# Patient Record
Sex: Male | Born: 1980 | Race: Black or African American | Hispanic: No | Marital: Married | Smoking: Current every day smoker
Health system: Southern US, Community
[De-identification: ages and names within clinical notes are randomized; demographics above are authoritative.]

---

## 2005-08-11 ENCOUNTER — Emergency Department (HOSPITAL_COMMUNITY): Admission: EM | Admit: 2005-08-11 | Discharge: 2005-08-11 | Payer: Self-pay | Admitting: Emergency Medicine

## 2007-01-01 ENCOUNTER — Emergency Department (HOSPITAL_COMMUNITY): Admission: EM | Admit: 2007-01-01 | Discharge: 2007-01-01 | Payer: Self-pay | Admitting: Emergency Medicine

## 2008-07-30 ENCOUNTER — Emergency Department (HOSPITAL_COMMUNITY): Admission: EM | Admit: 2008-07-30 | Discharge: 2008-07-30 | Payer: Self-pay | Admitting: Emergency Medicine

## 2008-09-12 ENCOUNTER — Ambulatory Visit (HOSPITAL_BASED_OUTPATIENT_CLINIC_OR_DEPARTMENT_OTHER): Admission: RE | Admit: 2008-09-12 | Discharge: 2008-09-12 | Payer: Self-pay | Admitting: Orthopedic Surgery

## 2010-10-07 NOTE — Op Note (Signed)
Bobby Lucas, AIKMAN                ACCOUNT NO.:  [id number]   MEDICAL RECORD NO.:  [id number]          PATIENT TYPE:  AMB   LOCATION:  [facility]                          FACILITY:  [facility]   PHYSICIAN:  Harvie Junior, M.D.   DATE OF BIRTH:  December 10, 1980   DATE OF PROCEDURE:  09/12/2008  DATE OF DISCHARGE:                               OPERATIVE REPORT   PREOPERATIVE DIAGNOSES:  Anterior cruciate ligament tear with lateral  meniscal tear.   POSTOPERATIVE DIAGNOSES:  1. Anterior cruciate ligament tear.  2. Lateral meniscal tear.  3. Chondromalacia of the medial femoral condyle.  4. Multiple osteocartilaginous loose bodies.   PRINCIPAL PROCEDURE:  1. Anterior cruciate ligament reconstruction with central 1/3 patellar      tendon allograft.  2. Arthroscopic removal of multiple osteocartilaginous loose bodies.  3. Resection of bucket-handle lateral meniscal tear.  4. Chondroplasty of medial and lateral femoral compartments with      drilling of the lateral osteochondral defect.   SURGEON:  Harvie Junior, MD   ASSISTANT:  Marshia Ly, PA   ANESTHESIA:  General.   BRIEF HISTORY:  Bobby Lucas is a 30 year old male with a long history of  having had twisting injury to his left knee.  He suffered anterior  cruciate ligament tear.  He was ultimately evaluated in the office and  noted to be Lachman positive and pivot positive.  He had lateral joint  line pain.  MRI was obtained, showed anterior cruciate tear with lateral  bucket.  Discussion for surgery was undertaken and the patient was taken  to the operating room for anterior cruciate ligament reconstruction.  We  talked preoperatively about autograft versus allograft and he did want  to proceed with an allograft reconstruction, and this was chosen for him  and he was brought to the operating room.   PROCEDURE:  The patient was brought to the operating room.  After  adequate anesthesia was obtained with general anesthetic, the patient  was placed supine on the operating table.  The left leg was then prepped  and draped in usual sterile fashion.  Once this was completed, the leg  was examined under anesthesia and noted to be Lachman positive and pivot  positive.  At this point, attention was turned towards routine prep and  drape and a routine arthroscope examination revealed there was an  obvious bucket-handle lateral meniscal tear.  This was released in the  front and released in the back and the piece was removed in toto.  Once  this was completed, attention was turned towards the lateral femoral  condyle because of her multiple cartilaginous loose bodies in the knee.  These were removed with the grasper, and at this point, attention was  turned towards the lateral femoral condyle where the downward slide  became obvious where there was about a 1 x 2 cm defect in the lateral  femoral condyle.  This was grade 3 and grade 4 in nature.  This was  cleaned up and actually then appeared to be a fair amount of grade 4  change in lateral femoral  condyle.  A 0.62 K-wire was then opened at  this point and drilled a multiple times, entered the marrow cavity right  through the osteochondral defect.  Following this, attention was turned  towards the central portion of the knee where the ACL completely torn.  Attention was turned to the medial side where the medial meniscus was  normal and the medial femoral condyle had some grade 2 change, which was  debrided.  Attention was turned back to the lateral side where after the  lateral bucket was resected, a resection shaver was used to shake down  the remaining meniscal rim.  Once this was contoured down, attention was  turned to the notch.  The notchplasty was performed with the combination  of a suction shaver and a motorized burr.  Once the notchplasty was  performed, attention was turned towards the ACL reconstruction.   The tibial guide was then advanced through the medial portal  and put in  place and the cannula was then advanced against the skin.  A small  incision was made at this point and then the guidewire was then placed  through the cannula directly to bone targeting the area in the tibia.  Once this was completed, this was overreamed to a level of 10 and a rasp  was used on the back portion of this tunnel.  Once this was completed, a  femoral over-the-top guide was used and the 6-mm anterior to the back, a  Beath needle was advanced up to distal lateral femur.  Once this was  completed, a 9-mm reamer was used.  Initially, a foot print was made to  make sure there was a back wall, then following this, we went to 25 mm.  Once that was completed, a notch was placed on the femoral tunnel and  then the graft was advanced into place.  It seated very nicely.  We did  about 27-mm tunnel on the femoral side to match a 25-mm bone plug.  At  this point, a guidewire was placed anterior to this bone plug and the  screw was then advanced through the guidewire just anterior to the bone  plug, locking this end on the back wall 7 x 25 screw.  Attention was  then turned to the tibial tunnel, the graft was somewhat up in the  tibial tunnel where the guidewire was placed anterior to the graft and  then a screw was placed over this guidewire locking the graft in place.  Excellent fixation of the graft was obtained.  The camera was then used  to make sure we had advanced the screw to the appropriate position.  The  knee was put through range of motion.  It was now Lachman negative,  pivot negative, no tendency towards anisometry prior to locking in the  tibial side.  A final check was made laterally.  No loose fragment in  pieces.  We could see the area where the microfracture technique had  been used.  Attention was then turned to the medial side where the  articular cartilage looked reasonable, the medial meniscus reasonable,  and ACL at this point looked to be appropriately  placed with good  rotation around the posterior cruciate.  At this point, the knee was  copiously and thoroughly irrigated and suctioned dry.  Thus all portals  were closed with a bandage.  The small incision made for the  introduction of the ACL was then closed with 0 and 2-0 Vicryl and 3-0  Monocryl subcuticular stitches.  Benzoin and Steri-Strips were applied  as well as sterile compression dressing and a  knee immobilizer.  The  patient was taken to the recovery room and was noted to be in  satisfactory condition.  Estimated blood loss for this procedure was  __________.      Harvie Junior, M.D.  Electronically Signed     JLG/MEDQ  D:  09/12/2008  T:  09/13/2008  Job:  [id number]

## 2010-11-02 ENCOUNTER — Emergency Department (HOSPITAL_COMMUNITY): Payer: Self-pay

## 2010-11-02 ENCOUNTER — Emergency Department (HOSPITAL_COMMUNITY)
Admission: EM | Admit: 2010-11-02 | Discharge: 2010-11-02 | Disposition: A | Discharge status: Home / Self Care | Payer: Self-pay | Attending: Emergency Medicine | Admitting: Emergency Medicine

## 2010-11-02 DIAGNOSIS — Y998 Other external cause status: Secondary | ICD-10-CM | POA: Insufficient documentation

## 2010-11-02 DIAGNOSIS — S0100XA Unspecified open wound of scalp, initial encounter: Secondary | ICD-10-CM | POA: Insufficient documentation

## 2010-11-02 DIAGNOSIS — M542 Cervicalgia: Secondary | ICD-10-CM | POA: Insufficient documentation

## 2010-11-02 DIAGNOSIS — Y92410 Unspecified street and highway as the place of occurrence of the external cause: Secondary | ICD-10-CM | POA: Insufficient documentation

## 2010-11-02 LAB — CBC
HCT: 41 % (ref 39.0–52.0)
Hemoglobin: 14.8 g/dL (ref 13.0–17.0)
MCV: 84.5 fL (ref 78.0–100.0)
Platelets: 165 10*3/uL (ref 150–400)
RBC: 4.85 MIL/uL (ref 4.22–5.81)
WBC: 4.7 10*3/uL (ref 4.0–10.5)

## 2010-11-02 LAB — COMPREHENSIVE METABOLIC PANEL
ALT: 34 U/L (ref 0–53)
Albumin: 4.3 g/dL (ref 3.5–5.2)
Alkaline Phosphatase: 43 U/L (ref 39–117)
GFR calc Af Amer: >60 mL/min (ref >60)
Potassium: 3.7 mEq/L (ref 3.5–5.1)
Sodium: 139 mEq/L (ref 135–145)
Total Bilirubin: 0.8 mg/dL (ref 0.3–1.2)
Total Protein: 7 g/dL (ref 6.0–8.3)

## 2010-11-02 LAB — POCT I-STAT, CHEM 8
BUN: 15 mg/dL (ref 6–23)
Creatinine, Ser: 1.3 mg/dL (ref 0.4–1.5)
Potassium: 3.5 mEq/L (ref 3.5–5.1)
Sodium: 137 mEq/L (ref 135–145)
TCO2: 23 mmol/L (ref 0–100)

## 2010-11-02 LAB — ABO/RH: ABO/RH(D): A POS

## 2010-11-02 MED ORDER — IOHEXOL 300 MG/ML  SOLN
100.0000 mL | Freq: Once | INTRAMUSCULAR | Status: AC | PRN
  Administered 2010-11-02: 100 mL via INTRAVENOUS

## 2010-11-03 LAB — TYPE AND SCREEN
Unit division: 0
Unit division: 0
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
## 2010-11-13 ENCOUNTER — Emergency Department: Payer: Self-pay | Admitting: Emergency Medicine

## 2013-03-30 ENCOUNTER — Emergency Department: Payer: Self-pay | Admitting: Emergency Medicine
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
## 2019-12-19 ENCOUNTER — Encounter (HOSPITAL_COMMUNITY): Payer: Self-pay | Admitting: Emergency Medicine

## 2019-12-19 ENCOUNTER — Emergency Department (HOSPITAL_COMMUNITY)
Admission: EM | Admit: 2019-12-19 | Discharge: 2019-12-19 | Disposition: A | Discharge status: Home / Self Care | Payer: Self-pay | Attending: Emergency Medicine | Admitting: Emergency Medicine

## 2019-12-19 ENCOUNTER — Emergency Department (HOSPITAL_COMMUNITY): Payer: Self-pay

## 2019-12-19 ENCOUNTER — Other Ambulatory Visit: Payer: Self-pay

## 2019-12-19 DIAGNOSIS — R079 Chest pain, unspecified: Secondary | ICD-10-CM

## 2019-12-19 DIAGNOSIS — R0789 Other chest pain: Secondary | ICD-10-CM | POA: Insufficient documentation

## 2019-12-19 LAB — BASIC METABOLIC PANEL
Anion gap: 9 (ref 5–15)
BUN: 9 mg/dL (ref 6–20)
CO2: 27 mmol/L (ref 22–32)
Calcium: 9.5 mg/dL (ref 8.9–10.3)
Chloride: 104 mmol/L (ref 98–111)
Creatinine, Ser: 1.17 mg/dL (ref 0.61–1.24)
GFR calc Af Amer: >60 mL/min (ref >60)
GFR calc non Af Amer: >60 mL/min (ref >60)
Glucose, Bld: 98 mg/dL (ref 70–99)
Potassium: 3.9 mmol/L (ref 3.5–5.1)
Sodium: 140 mmol/L (ref 135–145)

## 2019-12-19 LAB — CBC
HCT: 47 % (ref 39.0–52.0)
Hemoglobin: 15.4 g/dL (ref 13.0–17.0)
MCH: 30.2 pg (ref 26.0–34.0)
MCHC: 32.8 g/dL (ref 30.0–36.0)
MCV: 92.2 fL (ref 80.0–100.0)
Platelets: 196 10*3/uL (ref 150–400)
RBC: 5.1 MIL/uL (ref 4.22–5.81)
RDW: 11.9 % (ref 11.5–15.5)
WBC: 4.1 10*3/uL (ref 4.0–10.5)
nRBC: 0 % (ref 0.0–0.2)

## 2019-12-19 LAB — TROPONIN I (HIGH SENSITIVITY)
Troponin I (High Sensitivity): 3 ng/L (ref <18)
Troponin I (High Sensitivity): 4 ng/L (ref <18)

## 2019-12-19 MED ORDER — SODIUM CHLORIDE 0.9% FLUSH: 3.0000 mL | Freq: Once | INTRAVENOUS | Status: DC

## 2019-12-19 NOTE — ED Provider Notes (Signed)
[facility] EMERGENCY DEPARTMENT Provider Note   CSN: [id number] Arrival date & time: 12/19/19  7628     History Chief Complaint  Patient presents with   Chest Pain    Bobby Lucas is a 39 y.o. male.  The history is provided by the patient.  Chest Pain Pain location:  L chest Pain quality: aching   Pain radiates to:  Does not radiate Pain severity:  Mild Onset quality:  Gradual Timing:  Intermittent Progression:  Waxing and waning Chronicity:  Recurrent Context: at rest   Relieved by:  Nothing Worsened by:  Nothing Ineffective treatments:  None tried Associated symptoms: no abdominal pain, no altered mental status, no anorexia, no back pain, no claudication, no cough, no fever, no palpitations, no shortness of breath and no vomiting   Risk factors: no coronary artery disease, no diabetes mellitus, no high cholesterol, no hypertension, no prior DVT/PE and no smoking        History reviewed. No pertinent past medical history.  There are no problems to display for this patient.     No family history on file.  Social History   Tobacco Use   Smoking status: Not on file  Substance Use Topics   Alcohol use: Not on file   Drug use: Not on file    Home Medications Prior to Admission medications   Not on File    Allergies    Patient has no known allergies.  Review of Systems   Review of Systems  Constitutional: Negative for chills and fever.  HENT: Negative for ear pain and sore throat.   Eyes: Negative for pain and visual disturbance.  Respiratory: Negative for cough and shortness of breath.   Cardiovascular: Positive for chest pain. Negative for palpitations and claudication.  Gastrointestinal: Negative for abdominal pain, anorexia and vomiting.  Genitourinary: Negative for dysuria and hematuria.  Musculoskeletal: Negative for arthralgias and back pain.  Skin: Negative for color change and rash.  Neurological: Negative for  seizures and syncope.  All other systems reviewed and are negative.   Physical Exam Updated Vital Signs  ED Triage Vitals  Enc Vitals Group     BP 12/19/19 0731 128/81     Pulse Rate 12/19/19 0731 55     Resp 12/19/19 0731 16     Temp 12/19/19 0731 99 F (37.2 C)     Temp Source 12/19/19 0731 Oral     SpO2 12/19/19 0731 100 %     Weight 12/19/19 0731 (!) 205 lb (93 kg)     Height 12/19/19 0731 6\' 5"  (1.956 m)     Head Circumference --      Peak Flow --      Pain Score 12/19/19 0728 1     Pain Loc --      Pain Edu? --      Excl. in GC? --     Physical Exam Vitals and nursing note reviewed.  Constitutional:      Appearance: He is well-developed.  HENT:     Head: Normocephalic and atraumatic.  Eyes:     Extraocular Movements: Extraocular movements intact.     Conjunctiva/sclera: Conjunctivae normal.     Pupils: Pupils are equal, round, and reactive to light.  Cardiovascular:     Rate and Rhythm: Normal rate and regular rhythm.     Pulses:          Radial pulses are 2+ on the right side and 2+ on the left side.  Heart sounds: Normal heart sounds. No murmur heard.   Pulmonary:     Effort: Pulmonary effort is normal. No respiratory distress.     Breath sounds: Normal breath sounds. No decreased breath sounds or wheezing.  Abdominal:     Palpations: Abdomen is soft.     Tenderness: There is no abdominal tenderness.  Musculoskeletal:        General: Normal range of motion.     Cervical back: Normal range of motion and neck supple.     Right lower leg: No edema.     Left lower leg: No edema.  Skin:    General: Skin is warm and dry.     Capillary Refill: Capillary refill takes less than 2 seconds.  Neurological:     General: No focal deficit present.     Mental Status: He is alert.     ED Results / Procedures / Treatments   Labs (all labs ordered are listed, but only abnormal results are displayed) Labs Reviewed  BASIC METABOLIC PANEL  CBC  TROPONIN I  (HIGH SENSITIVITY)  TROPONIN I (HIGH SENSITIVITY)    EKG EKG Interpretation  Date/Time:  Tuesday December 19 2019 07:25:49 EDT Ventricular Rate:  49 PR Interval:  180 QRS Duration: 110 QT Interval:  394 QTC Calculation: 355 R Axis:   74 Text Interpretation: Sinus bradycardia with sinus arrhythmia Minimal voltage criteria for LVH, may be normal variant ( Sokolow-Lyon ) Early repolarization Borderline ECG Confirmed by Virgina Norfolk [phone]) on 12/19/2019 11:26:29 AM   Radiology DG Chest 2 View  Result Date: 12/19/2019 CLINICAL DATA:  Chest pain. EXAM: CHEST - 2 VIEW COMPARISON:  CT 06/03/2010.  Chest x-ray 06/03/2010. FINDINGS: Mediastinum and hilar structures normal. Mild left base pleural-parenchymal thickening again noted consistent scarring. Mild biapical pleural thickening noted consistent scarring. No acute infiltrate. No pleural effusion or pneumothorax. Heart size normal. Degenerative change thoracic spine. IMPRESSION: Mild pleuroparenchymal thickening consistent scarring. No acute cardiopulmonary disease. Electronically Signed   By: Maisie Fus  Register   On: 12/19/2019 07:50    Procedures Procedures (including critical care time)  Medications Ordered in ED Medications  sodium chloride flush (NS) 0.9 % injection 3 mL (has no administration in time range)    ED Course  I have reviewed the triage vital signs and the nursing notes.  Pertinent labs & imaging results that were available during my care of the patient were reviewed by me and considered in my medical decision making (see chart for details).    MDM Rules/Calculators/A&P                          Bobby Lucas is a 39 year old male with no significant medical history presents the ED with chest pain.  Patient having intermittent chest pain for the last several months.  No cardiac history.  No medical problems.  Vital signs are normal.  EKG shows sinus rhythm.  Consistent with benign early repolarization.  Troponin negative  x2.  No PE risk factors.  No active chest pain.  Doubt ACS.  No significant anemia, electrolyte abnormality, kidney injury.  Chest x-ray with no signs of pneumonia, no pneumothorax, no pleural effusion. No abdominal pain currently.  Suspect that this is GI related or MSK related pain.  We will give him information to follow-up with primary care.  Given return precautions and discharged from ED in good condition.  This chart was dictated using voice recognition software.  Despite best efforts to proofread,  errors can occur which can change the documentation meaning.    Final Clinical Impression(s) / ED Diagnoses Final diagnoses:  Nonspecific chest pain    Rx / DC Orders ED Discharge Orders    None       Virgina Norfolk, DO 12/19/19 1146

## 2019-12-19 NOTE — ED Triage Notes (Signed)
Pt states he has been having cp for 3-4 months that is very intermittent and never last very long. Pt states last night he had an intense pain which lasted about 1 hour and now eased. Pt decided to get checked since this pain was so bad last night. Pt denies any symptoms at this time.

## 2020-07-26 ENCOUNTER — Emergency Department (HOSPITAL_COMMUNITY)
Admission: EM | Admit: 2020-07-26 | Discharge: 2020-07-26 | Disposition: A | Discharge status: Home / Self Care | Payer: Self-pay | Attending: Emergency Medicine | Admitting: Emergency Medicine

## 2020-07-26 ENCOUNTER — Encounter (HOSPITAL_COMMUNITY): Payer: Self-pay

## 2020-07-26 DIAGNOSIS — R1110 Vomiting, unspecified: Secondary | ICD-10-CM | POA: Insufficient documentation

## 2020-07-26 DIAGNOSIS — R197 Diarrhea, unspecified: Secondary | ICD-10-CM | POA: Insufficient documentation

## 2020-07-26 DIAGNOSIS — K529 Noninfective gastroenteritis and colitis, unspecified: Secondary | ICD-10-CM

## 2020-07-26 LAB — CBC WITH DIFFERENTIAL/PLATELET
Abs Immature Granulocytes: 0.03 10*3/uL (ref 0.00–0.07)
Basophils Absolute: 0 10*3/uL (ref 0.0–0.1)
Basophils Relative: 0 %
Eosinophils Absolute: 0 10*3/uL (ref 0.0–0.5)
Eosinophils Relative: 0 %
HCT: 43.6 % (ref 39.0–52.0)
Hemoglobin: 15 g/dL (ref 13.0–17.0)
Immature Granulocytes: 0 %
Lymphocytes Relative: 7 %
Lymphs Abs: 0.7 10*3/uL (ref 0.7–4.0)
MCH: 30.9 pg (ref 26.0–34.0)
MCHC: 34.4 g/dL (ref 30.0–36.0)
MCV: 89.7 fL (ref 80.0–100.0)
Monocytes Absolute: 0.2 10*3/uL (ref 0.1–1.0)
Monocytes Relative: 2 %
Neutro Abs: 9.4 10*3/uL — ABNORMAL HIGH (ref 1.7–7.7)
Neutrophils Relative %: 91 %
Platelets: 219 10*3/uL (ref 150–400)
RBC: 4.86 MIL/uL (ref 4.22–5.81)
RDW: 11.9 % (ref 11.5–15.5)
WBC: 10.4 10*3/uL (ref 4.0–10.5)
nRBC: 0 % (ref 0.0–0.2)

## 2020-07-26 LAB — COMPREHENSIVE METABOLIC PANEL
ALT: 25 U/L (ref 0–44)
AST: 30 U/L (ref 15–41)
Albumin: 5.5 g/dL — ABNORMAL HIGH (ref 3.5–5.0)
Alkaline Phosphatase: 42 U/L (ref 38–126)
Anion gap: 13 (ref 5–15)
BUN: 13 mg/dL (ref 6–20)
CO2: 25 mmol/L (ref 22–32)
Calcium: 10.5 mg/dL — ABNORMAL HIGH (ref 8.9–10.3)
Chloride: 102 mmol/L (ref 98–111)
Creatinine, Ser: 1.16 mg/dL (ref 0.61–1.24)
GFR, Estimated: >60 mL/min (ref >60)
Glucose, Bld: 142 mg/dL — ABNORMAL HIGH (ref 70–99)
Potassium: 4.3 mmol/L (ref 3.5–5.1)
Sodium: 140 mmol/L (ref 135–145)
Total Bilirubin: 1.3 mg/dL — ABNORMAL HIGH (ref 0.3–1.2)
Total Protein: 8.5 g/dL — ABNORMAL HIGH (ref 6.5–8.1)

## 2020-07-26 LAB — LIPASE, BLOOD: Lipase: 21 U/L (ref 11–51)

## 2020-07-26 MED ORDER — SODIUM CHLORIDE 0.9 % IV SOLN: INTRAVENOUS | Status: DC

## 2020-07-26 MED ORDER — ONDANSETRON HCL 4 MG/2ML IJ SOLN
4.0000 mg | Freq: Once | INTRAMUSCULAR | Status: AC
  Administered 2020-07-26: 4 mg via INTRAVENOUS
  Filled 2020-07-26: qty 2

## 2020-07-26 MED ORDER — METOCLOPRAMIDE HCL 5 MG/ML IJ SOLN
10.0000 mg | Freq: Once | INTRAMUSCULAR | Status: AC
  Administered 2020-07-26: 10 mg via INTRAVENOUS
  Filled 2020-07-26: qty 2

## 2020-07-26 MED ORDER — ONDANSETRON 8 MG PO TBDP: 8.0000 mg | ORAL_TABLET | Freq: Three times a day (TID) | ORAL | 0 refills | Status: AC | PRN

## 2020-07-26 MED ORDER — SODIUM CHLORIDE 0.9 % IV BOLUS
2000.0000 mL | Freq: Once | INTRAVENOUS | Status: AC
  Administered 2020-07-26: 2000 mL via INTRAVENOUS

## 2020-07-26 NOTE — ED Provider Notes (Signed)
[facility]-EMERGENCY DEPT Provider Note   CSN: [id number] Arrival date & time: 07/26/20  0327     History No chief complaint on file.   Bobby Lucas is a 40 y.o. male.  40 year old male presents with several hour history of watery diarrhea which then progressed to nonbilious emesis.  Some abdominal cramping but denies any pain.  No fever chills pain or URI symptoms.  Denies any urinary symptoms.  No recent sick exposures.  Denies any possible bad food exposure.  No treatment use prior to arrival.  Nothing makes symptoms better or worse        History reviewed. No pertinent past medical history.  There are no problems to display for this patient.   History reviewed. No pertinent surgical history.     History reviewed. No pertinent family history.  Social History   Tobacco Use  . Smoking status: Never Smoker  . Smokeless tobacco: Never Used  Substance Use Topics  . Alcohol use: Never  . Drug use: Never    Home Medications Prior to Admission medications   Not on File    Allergies    Patient has no known allergies.  Review of Systems   Review of Systems  All other systems reviewed and are negative.   Physical Exam Updated Vital Signs BP 139/61 (BP Location: Left Arm)   Pulse (!) 58   Temp 98.1 F (36.7 C) (Oral)   Resp 18   Ht 1.956 m (6\' 5" )   Wt 93 kg   SpO2 100%   BMI 24.31 kg/m   Physical Exam Vitals and nursing note reviewed.  Constitutional:      General: He is not in acute distress.    Appearance: Normal appearance. He is well-developed and well-nourished. He is not toxic-appearing.  HENT:     Head: Normocephalic and atraumatic.  Eyes:     General: Lids are normal.     Extraocular Movements: EOM normal.     Conjunctiva/sclera: Conjunctivae normal.     Pupils: Pupils are equal, round, and reactive to light.  Neck:     Thyroid: No thyroid mass.     Trachea: No tracheal deviation.  Cardiovascular:     Rate and  Rhythm: Normal rate and regular rhythm.     Heart sounds: Normal heart sounds. No murmur heard. No gallop.   Pulmonary:     Effort: Pulmonary effort is normal. No respiratory distress.     Breath sounds: Normal breath sounds. No stridor. No decreased breath sounds, wheezing, rhonchi or rales.  Abdominal:     General: Bowel sounds are normal. There is no distension.     Palpations: Abdomen is soft.     Tenderness: There is no abdominal tenderness. There is no CVA tenderness or rebound.  Musculoskeletal:        General: No tenderness or edema. Normal range of motion.     Cervical back: Normal range of motion and neck supple.  Skin:    General: Skin is warm and dry.     Findings: No abrasion or rash.  Neurological:     Mental Status: He is alert and oriented to person, place, and time.     GCS: GCS eye subscore is 4. GCS verbal subscore is 5. GCS motor subscore is 6.     Cranial Nerves: No cranial nerve deficit.     Sensory: No sensory deficit.     Deep Tendon Reflexes: Strength normal.  Psychiatric:  Mood and Affect: Mood and affect normal.        Speech: Speech normal.        Behavior: Behavior normal.     ED Results / Procedures / Treatments   Labs (all labs ordered are listed, but only abnormal results are displayed) Labs Reviewed  CBC WITH DIFFERENTIAL/PLATELET  COMPREHENSIVE METABOLIC PANEL  LIPASE, BLOOD  URINALYSIS, ROUTINE W REFLEX MICROSCOPIC  RAPID URINE DRUG SCREEN, HOSP PERFORMED    EKG None  Radiology No results found.  Procedures Procedures   Medications Ordered in ED Medications  sodium chloride 0.9 % bolus 2,000 mL (has no administration in time range)  0.9 %  sodium chloride infusion (has no administration in time range)  ondansetron (ZOFRAN) injection 4 mg (has no administration in time range)  metoCLOPramide (REGLAN) injection 10 mg (has no administration in time range)    ED Course  I have reviewed the triage vital signs and the  nursing notes.  Pertinent labs & imaging results that were available during my care of the patient were reviewed by me and considered in my medical decision making (see chart for details).    MDM Rules/Calculators/A&P                           Final Clinical Impression(s) / ED Diagnoses Final diagnoses:  None  Patient's labs are reassuring here.  Given IV fluids along with Reglan feels much better.  He continues to deny have any abdominal discomfort.  His abdominal exam is benign.  Suspect viral gastroenteritis and will discharge home  Rx / DC Orders ED Discharge Orders    None       Lorre Nick, MD 07/26/20 0510

## 2020-07-26 NOTE — ED Notes (Signed)
Asked patient to provide urine sample again. Urinal at bedside.

## 2020-07-26 NOTE — ED Notes (Signed)
Family at bedside.Wife.

## 2020-07-26 NOTE — ED Triage Notes (Signed)
Pt states he started getting nauseated at work last night and has been vomiting since 10pm, hasn't been sick and didn't eat anything out of the normal

## 2020-07-26 NOTE — ED Notes (Addendum)
Pt actively N/V & gagging small amounts of emesis. Appearance is yellow/clear.

## 2020-10-05 ENCOUNTER — Emergency Department (HOSPITAL_COMMUNITY)
Admission: EM | Admit: 2020-10-05 | Discharge: 2020-10-05 | Disposition: A | Discharge status: Home / Self Care | Payer: Self-pay | Attending: Emergency Medicine | Admitting: Emergency Medicine

## 2020-10-05 ENCOUNTER — Other Ambulatory Visit: Payer: Self-pay

## 2020-10-05 ENCOUNTER — Encounter (HOSPITAL_COMMUNITY): Payer: Self-pay | Admitting: Emergency Medicine

## 2020-10-05 DIAGNOSIS — R1110 Vomiting, unspecified: Secondary | ICD-10-CM | POA: Insufficient documentation

## 2020-10-05 DIAGNOSIS — A084 Viral intestinal infection, unspecified: Secondary | ICD-10-CM

## 2020-10-05 DIAGNOSIS — Z20822 Contact with and (suspected) exposure to covid-19: Secondary | ICD-10-CM | POA: Insufficient documentation

## 2020-10-05 DIAGNOSIS — R197 Diarrhea, unspecified: Secondary | ICD-10-CM | POA: Insufficient documentation

## 2020-10-05 DIAGNOSIS — E860 Dehydration: Secondary | ICD-10-CM | POA: Insufficient documentation

## 2020-10-05 LAB — CBC WITH DIFFERENTIAL/PLATELET
Abs Immature Granulocytes: 0.02 10*3/uL (ref 0.00–0.07)
Basophils Absolute: 0 10*3/uL (ref 0.0–0.1)
Basophils Relative: 0 %
Eosinophils Absolute: 0 10*3/uL (ref 0.0–0.5)
Eosinophils Relative: 0 %
HCT: 43.9 % (ref 39.0–52.0)
Hemoglobin: 15.5 g/dL (ref 13.0–17.0)
Immature Granulocytes: 0 %
Lymphocytes Relative: 13 %
Lymphs Abs: 1.3 10*3/uL (ref 0.7–4.0)
MCH: 30.9 pg (ref 26.0–34.0)
MCHC: 35.3 g/dL (ref 30.0–36.0)
MCV: 87.5 fL (ref 80.0–100.0)
Monocytes Absolute: 0.9 10*3/uL (ref 0.1–1.0)
Monocytes Relative: 10 %
Neutro Abs: 7.5 10*3/uL (ref 1.7–7.7)
Neutrophils Relative %: 77 %
Platelets: 216 10*3/uL (ref 150–400)
RBC: 5.02 MIL/uL (ref 4.22–5.81)
RDW: 12.2 % (ref 11.5–15.5)
WBC: 9.7 10*3/uL (ref 4.0–10.5)
nRBC: 0 % (ref 0.0–0.2)

## 2020-10-05 LAB — COMPREHENSIVE METABOLIC PANEL
ALT: 19 U/L (ref 0–44)
AST: 23 U/L (ref 15–41)
Albumin: 5.4 g/dL — ABNORMAL HIGH (ref 3.5–5.0)
Alkaline Phosphatase: 42 U/L (ref 38–126)
Anion gap: 10 (ref 5–15)
BUN: 27 mg/dL — ABNORMAL HIGH (ref 6–20)
CO2: 31 mmol/L (ref 22–32)
Calcium: 10.5 mg/dL — ABNORMAL HIGH (ref 8.9–10.3)
Chloride: 101 mmol/L (ref 98–111)
Creatinine, Ser: 1.47 mg/dL — ABNORMAL HIGH (ref 0.61–1.24)
GFR, Estimated: >60 mL/min (ref >60)
Glucose, Bld: 127 mg/dL — ABNORMAL HIGH (ref 70–99)
Potassium: 3.4 mmol/L — ABNORMAL LOW (ref 3.5–5.1)
Sodium: 142 mmol/L (ref 135–145)
Total Bilirubin: 1.4 mg/dL — ABNORMAL HIGH (ref 0.3–1.2)
Total Protein: 9 g/dL — ABNORMAL HIGH (ref 6.5–8.1)

## 2020-10-05 LAB — RESP PANEL BY RT-PCR (FLU A&B, COVID) ARPGX2
Influenza A by PCR: NEGATIVE
Influenza B by PCR: NEGATIVE
SARS Coronavirus 2 by RT PCR: NEGATIVE

## 2020-10-05 LAB — LIPASE, BLOOD: Lipase: 22 U/L (ref 11–51)

## 2020-10-05 MED ORDER — ONDANSETRON 8 MG PO TBDP: 8.0000 mg | ORAL_TABLET | Freq: Three times a day (TID) | ORAL | 0 refills | Status: AC | PRN

## 2020-10-05 MED ORDER — METOCLOPRAMIDE HCL 5 MG/ML IJ SOLN
5.0000 mg | Freq: Once | INTRAMUSCULAR | Status: AC
  Administered 2020-10-05: 5 mg via INTRAVENOUS
  Filled 2020-10-05: qty 2

## 2020-10-05 MED ORDER — LACTATED RINGERS IV BOLUS
2000.0000 mL | Freq: Once | INTRAVENOUS | Status: AC
  Administered 2020-10-05: 2000 mL via INTRAVENOUS

## 2020-10-05 MED ORDER — LACTATED RINGERS IV SOLN: INTRAVENOUS | Status: DC

## 2020-10-05 MED ORDER — ONDANSETRON HCL 4 MG/2ML IJ SOLN
4.0000 mg | Freq: Once | INTRAMUSCULAR | Status: AC
  Administered 2020-10-05: 4 mg via INTRAVENOUS
  Filled 2020-10-05: qty 2

## 2020-10-05 NOTE — ED Triage Notes (Signed)
Pt reports that he started vomiting last night. Also reports an intermittent L sided abdominal pain. Has been unable to tolerate water or ginger ale. He tried pepto without relief. No pain right now.

## 2020-10-05 NOTE — ED Provider Notes (Signed)
[facility]-EMERGENCY DEPT Provider Note   CSN: [id number] Arrival date & time: 10/05/20  1859     History Chief Complaint  Patient presents with  . Vomiting    Bobby Lucas is a 40 y.o. male.  40 year old male presents with 24 hours of nonbilious emesis with associated watery diarrhea.  Has had 1 episode of chills but nothing persistent.  No cough or congestion.  No dyspnea.  Has attempted to drink water ginger ale without relief.  Also tried Pepto-Bismol without relief.  Did have some abdominal crampiness which was transient and now resolved.  Denies any pain right now but denies any urinary symptoms.  He is fully vaccinated against COVID        History reviewed. No pertinent past medical history.  There are no problems to display for this patient.   History reviewed. No pertinent surgical history.     History reviewed. No pertinent family history.  Social History   Tobacco Use  . Smoking status: Never Smoker  . Smokeless tobacco: Never Used  Substance Use Topics  . Alcohol use: Never  . Drug use: Never    Home Medications Prior to Admission medications   Medication Sig Start Date End Date Taking? Authorizing Provider  ondansetron (ZOFRAN ODT) 8 MG disintegrating tablet Take 1 tablet (8 mg total) by mouth every 8 (eight) hours as needed for nausea or vomiting. 07/26/20   Lorre Nick, MD    Allergies    Patient has no known allergies.  Review of Systems   Review of Systems  All other systems reviewed and are negative.   Physical Exam Updated Vital Signs BP 112/78 (BP Location: Left Arm)   Pulse 83   Temp 98.4 F (36.9 C) (Oral)   Resp 18   Ht 1.956 m (6\' 5" )   Wt 95.3 kg   SpO2 96%   BMI 24.90 kg/m   Physical Exam Vitals and nursing note reviewed.  Constitutional:      General: He is not in acute distress.    Appearance: Normal appearance. He is well-developed. He is not toxic-appearing.  HENT:     Head: Normocephalic  and atraumatic.  Eyes:     General: Lids are normal.     Conjunctiva/sclera: Conjunctivae normal.     Pupils: Pupils are equal, round, and reactive to light.  Neck:     Thyroid: No thyroid mass.     Trachea: No tracheal deviation.  Cardiovascular:     Rate and Rhythm: Normal rate and regular rhythm.     Heart sounds: Normal heart sounds. No murmur heard. No gallop.   Pulmonary:     Effort: Pulmonary effort is normal. No respiratory distress.     Breath sounds: Normal breath sounds. No stridor. No decreased breath sounds, wheezing, rhonchi or rales.  Abdominal:     General: Bowel sounds are normal. There is no distension.     Palpations: Abdomen is soft.     Tenderness: There is no abdominal tenderness. There is no rebound.  Musculoskeletal:        General: No tenderness. Normal range of motion.     Cervical back: Normal range of motion and neck supple.  Skin:    General: Skin is warm and dry.     Findings: No abrasion or rash.  Neurological:     Mental Status: He is alert and oriented to person, place, and time.     GCS: GCS eye subscore is 4. GCS verbal subscore  is 5. GCS motor subscore is 6.     Cranial Nerves: No cranial nerve deficit.     Sensory: No sensory deficit.  Psychiatric:        Speech: Speech normal.        Behavior: Behavior normal.     ED Results / Procedures / Treatments   Labs (all labs ordered are listed, but only abnormal results are displayed) Labs Reviewed  RESP PANEL BY RT-PCR (FLU A&B, COVID) ARPGX2  CBC WITH DIFFERENTIAL/PLATELET  COMPREHENSIVE METABOLIC PANEL  LIPASE, BLOOD    EKG None  Radiology No results found.  Procedures Procedures   Medications Ordered in ED Medications  lactated ringers infusion (has no administration in time range)  lactated ringers bolus 2,000 mL (has no administration in time range)  ondansetron (ZOFRAN) injection 4 mg (has no administration in time range)  metoCLOPramide (REGLAN) injection 5 mg (has no  administration in time range)    ED Course  I have reviewed the triage vital signs and the nursing notes.  Pertinent labs & imaging results that were available during my care of the patient were reviewed by me and considered in my medical decision making (see chart for details).    MDM Rules/Calculators/A&P                          Patient given 2 L of IV fluids here.  Have evidence of dehydration.  COVID test is negative.  Likely viral gastroenteritis and will discharge home Final Clinical Impression(s) / ED Diagnoses Final diagnoses:  None    Rx / DC Orders ED Discharge Orders    None       Lorre Nick, MD 10/05/20 2220

## 2022-01-22 IMAGING — CR DG CHEST 2V
2 series · 2 of 2 positions shown · non-contrast
Comparison: CT 06/03/2010.  Chest x-ray 06/03/2010.

CLINICAL DATA: Chest pain.

EXAM:
CHEST - 2 VIEW

[chest pa]
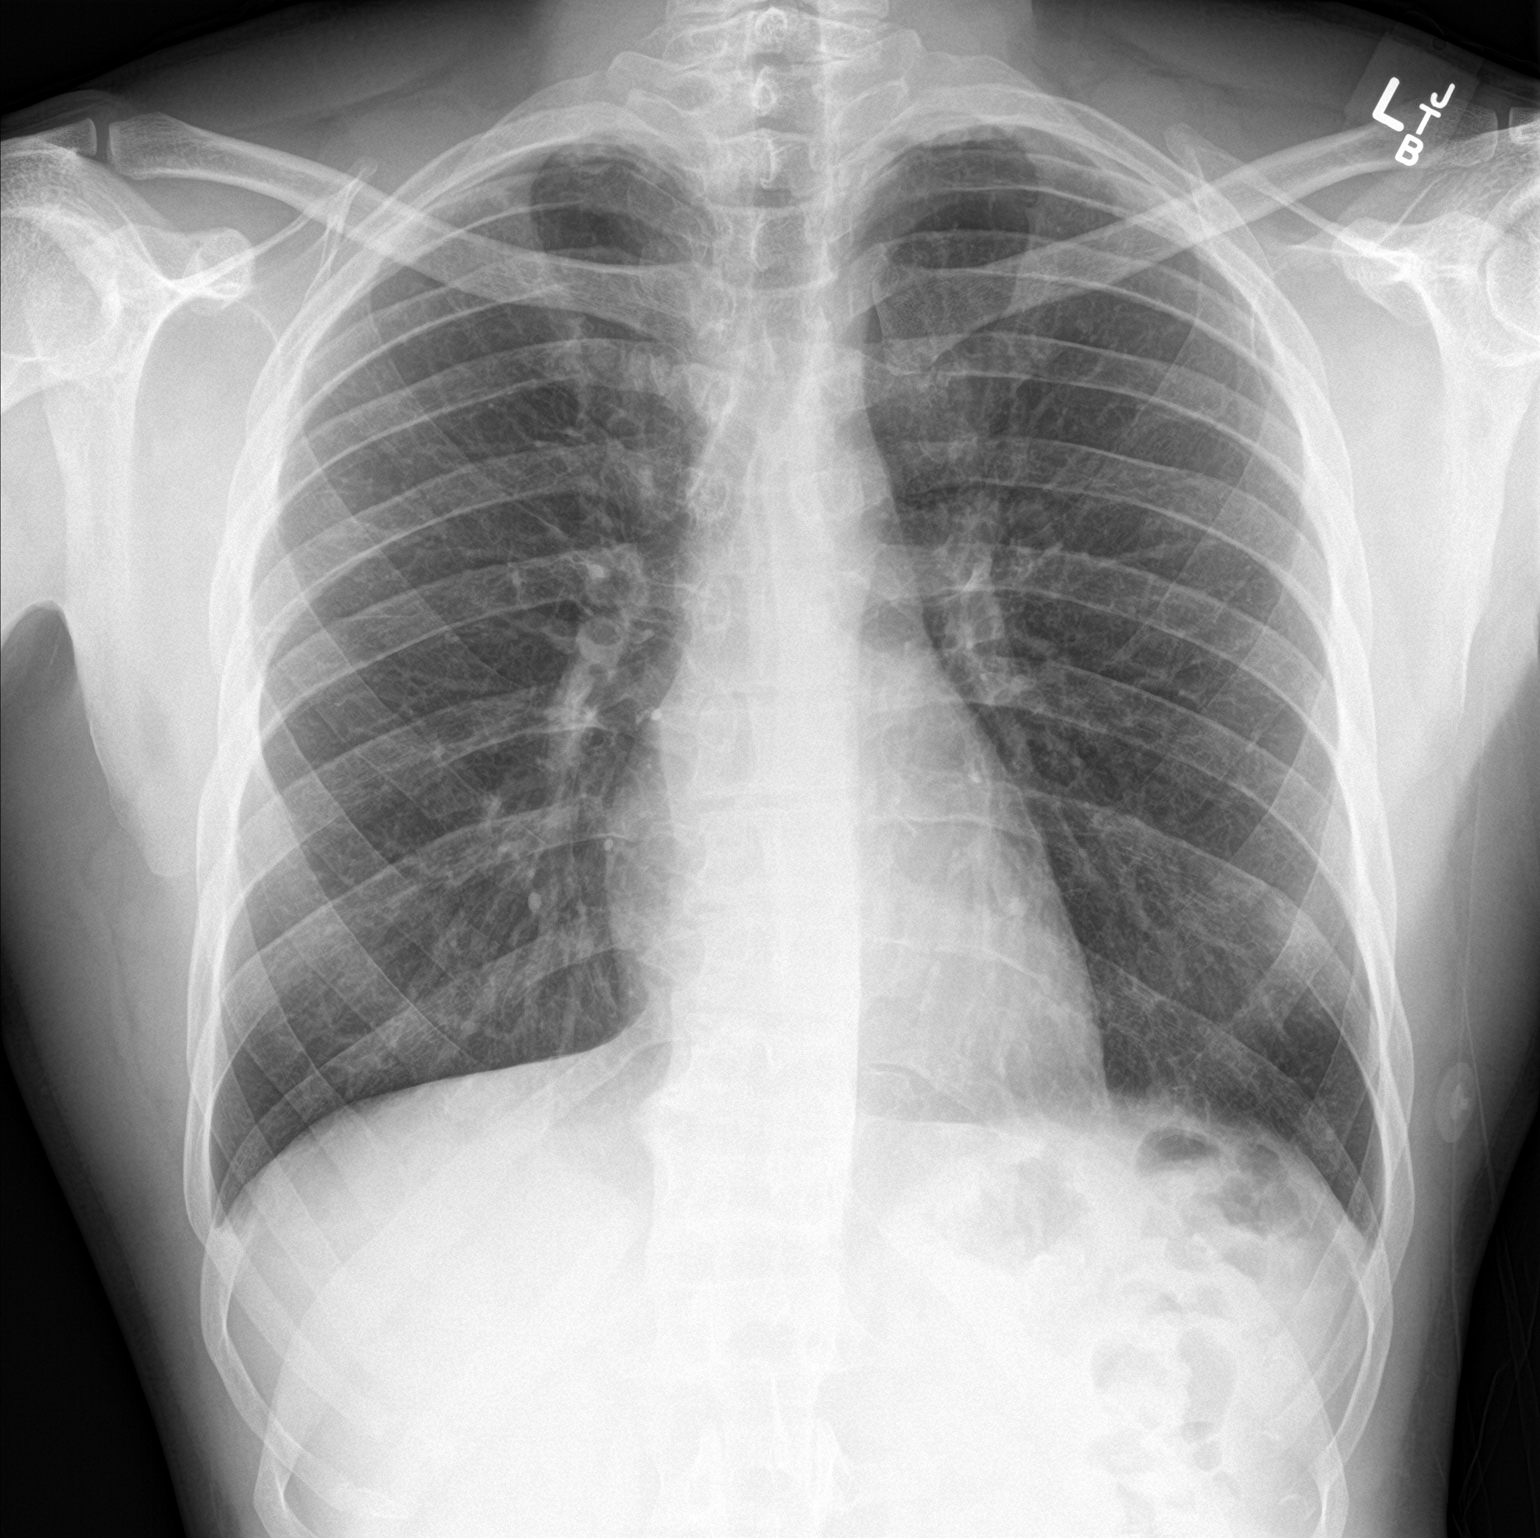

[chest lat]
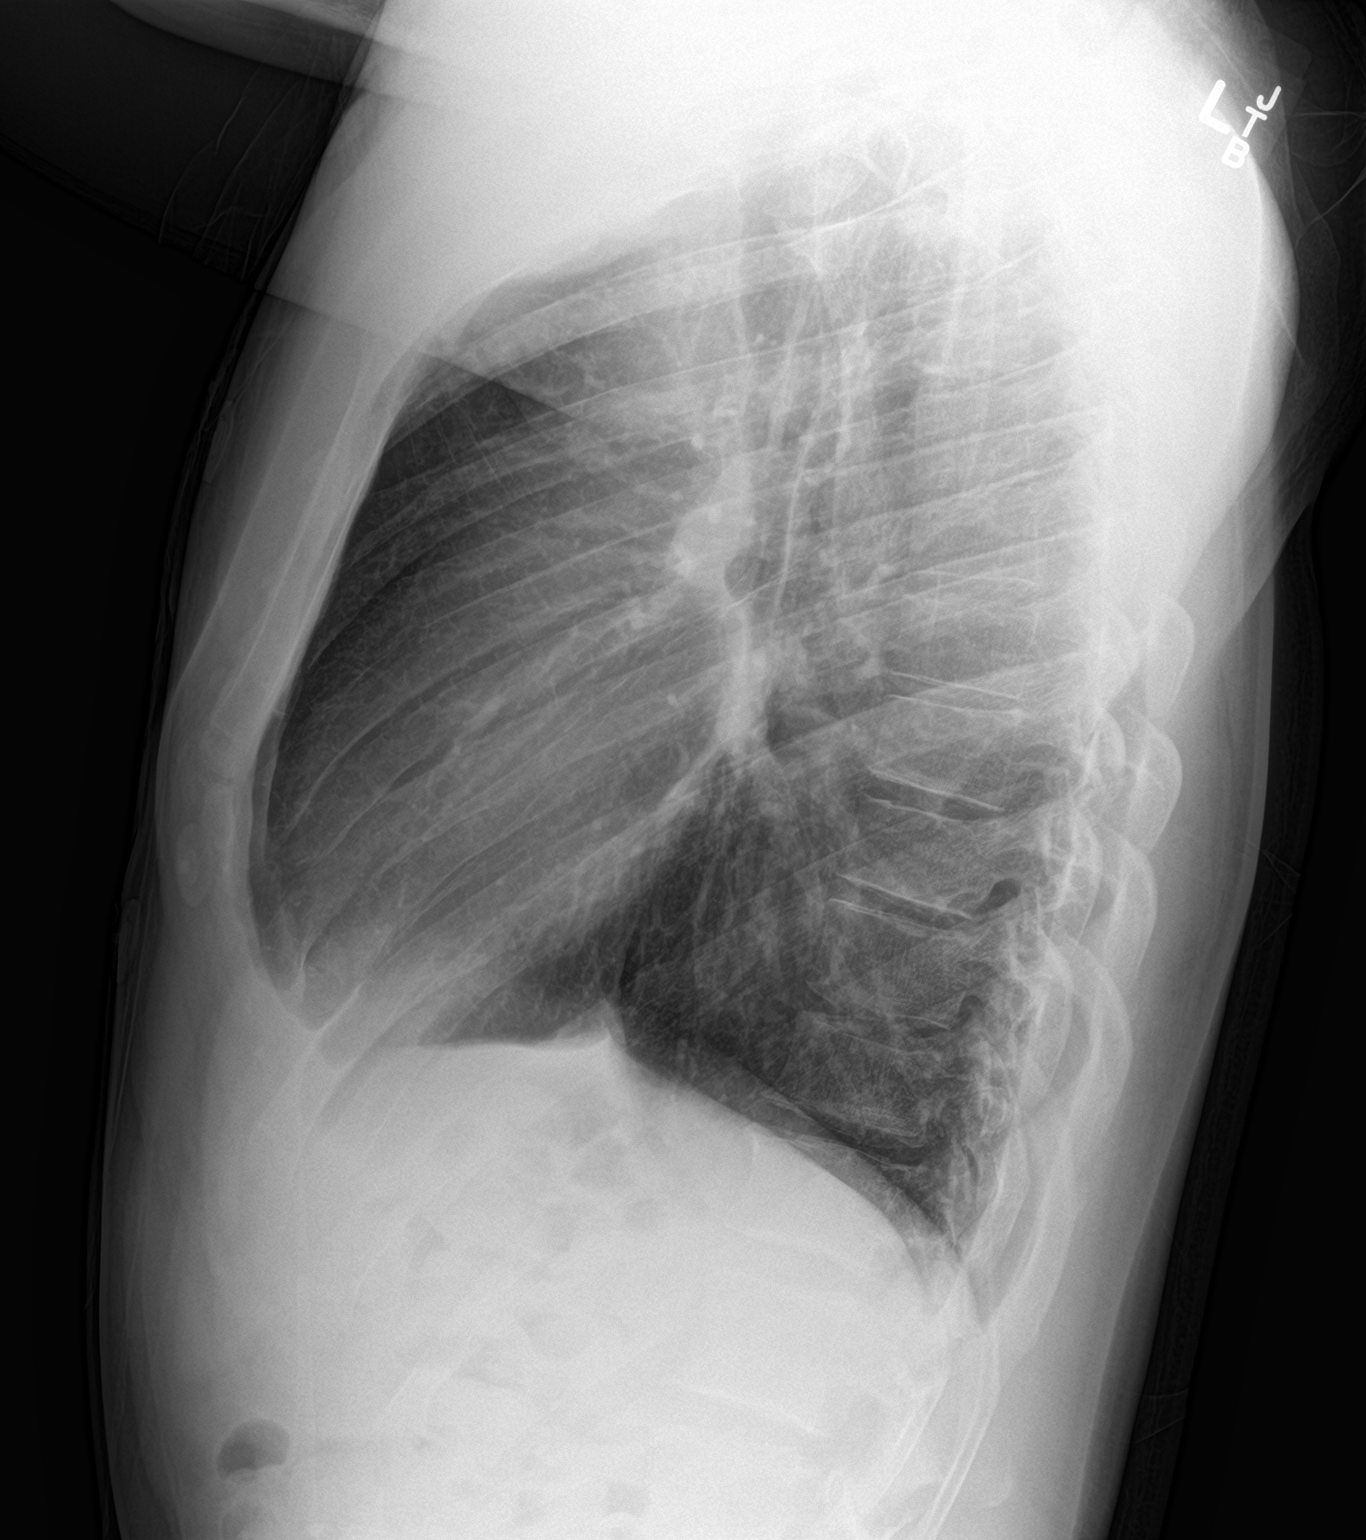

[2 of 2 positions shown; findings below may reference images not displayed]

FINDINGS: Mediastinum and hilar structures normal. Mild left base
pleural-parenchymal thickening again noted consistent scarring. Mild
biapical pleural thickening noted consistent scarring. No acute
infiltrate. No pleural effusion or pneumothorax. Heart size normal.
Degenerative change thoracic spine.
IMPRESSION: Mild pleuroparenchymal thickening consistent scarring. No acute
cardiopulmonary disease.

## 2022-10-05 ENCOUNTER — Encounter (HOSPITAL_COMMUNITY): Payer: Self-pay

## 2022-10-05 ENCOUNTER — Other Ambulatory Visit: Payer: Self-pay

## 2022-10-05 ENCOUNTER — Emergency Department (HOSPITAL_COMMUNITY)
Admission: EM | Admit: 2022-10-05 | Discharge: 2022-10-05 | Disposition: A | Discharge status: Home / Self Care | Payer: Self-pay | Attending: Emergency Medicine | Admitting: Emergency Medicine

## 2022-10-05 DIAGNOSIS — R112 Nausea with vomiting, unspecified: Secondary | ICD-10-CM | POA: Insufficient documentation

## 2022-10-05 DIAGNOSIS — R197 Diarrhea, unspecified: Secondary | ICD-10-CM | POA: Insufficient documentation

## 2022-10-05 LAB — COMPREHENSIVE METABOLIC PANEL
ALT: 31 U/L (ref 0–44)
AST: 31 U/L (ref 15–41)
Albumin: 4.8 g/dL (ref 3.5–5.0)
Alkaline Phosphatase: 43 U/L (ref 38–126)
Anion gap: 15 (ref 5–15)
BUN: 15 mg/dL (ref 6–20)
CO2: 24 mmol/L (ref 22–32)
Calcium: 9.8 mg/dL (ref 8.9–10.3)
Chloride: 102 mmol/L (ref 98–111)
Creatinine, Ser: 1.29 mg/dL — ABNORMAL HIGH (ref 0.61–1.24)
GFR, Estimated: >60 mL/min (ref >60)
Glucose, Bld: 135 mg/dL — ABNORMAL HIGH (ref 70–99)
Potassium: 4.3 mmol/L (ref 3.5–5.1)
Sodium: 141 mmol/L (ref 135–145)
Total Bilirubin: 0.9 mg/dL (ref 0.3–1.2)
Total Protein: 7.7 g/dL (ref 6.5–8.1)

## 2022-10-05 LAB — CBC WITH DIFFERENTIAL/PLATELET
Abs Immature Granulocytes: 0.04 10*3/uL (ref 0.00–0.07)
Basophils Absolute: 0 10*3/uL (ref 0.0–0.1)
Basophils Relative: 0 %
Eosinophils Absolute: 0 10*3/uL (ref 0.0–0.5)
Eosinophils Relative: 0 %
HCT: 45.6 % (ref 39.0–52.0)
Hemoglobin: 15.4 g/dL (ref 13.0–17.0)
Immature Granulocytes: 1 %
Lymphocytes Relative: 8 %
Lymphs Abs: 0.7 10*3/uL (ref 0.7–4.0)
MCH: 30.5 pg (ref 26.0–34.0)
MCHC: 33.8 g/dL (ref 30.0–36.0)
MCV: 90.3 fL (ref 80.0–100.0)
Monocytes Absolute: 0.2 10*3/uL (ref 0.1–1.0)
Monocytes Relative: 2 %
Neutro Abs: 7.8 10*3/uL — ABNORMAL HIGH (ref 1.7–7.7)
Neutrophils Relative %: 89 %
Platelets: 163 10*3/uL (ref 150–400)
RBC: 5.05 MIL/uL (ref 4.22–5.81)
RDW: 11.8 % (ref 11.5–15.5)
WBC: 8.7 10*3/uL (ref 4.0–10.5)
nRBC: 0 % (ref 0.0–0.2)

## 2022-10-05 LAB — LIPASE, BLOOD: Lipase: 22 U/L (ref 11–51)

## 2022-10-05 MED ORDER — METOCLOPRAMIDE HCL 10 MG PO TABS: 10.0000 mg | ORAL_TABLET | Freq: Four times a day (QID) | ORAL | 0 refills | Status: AC | PRN

## 2022-10-05 MED ORDER — METOCLOPRAMIDE HCL 5 MG/ML IJ SOLN
10.0000 mg | Freq: Once | INTRAMUSCULAR | Status: AC
  Administered 2022-10-05: 10 mg via INTRAVENOUS
  Filled 2022-10-05: qty 2

## 2022-10-05 MED ORDER — ONDANSETRON HCL 4 MG/2ML IJ SOLN
4.0000 mg | Freq: Once | INTRAMUSCULAR | Status: AC
  Administered 2022-10-05: 4 mg via INTRAVENOUS
  Filled 2022-10-05: qty 2

## 2022-10-05 MED ORDER — LACTATED RINGERS IV BOLUS
1000.0000 mL | Freq: Once | INTRAVENOUS | Status: AC
  Administered 2022-10-05: 1000 mL via INTRAVENOUS

## 2022-10-05 NOTE — ED Triage Notes (Signed)
Patient reports nausea vomiting and diarrhea since 11pm.  Reports just feeling weak but no abd pain.

## 2022-10-05 NOTE — ED Provider Notes (Signed)
EMERGENCY DEPARTMENT AT [facility] Provider Note   CSN: [id number] Arrival date & time: 10/05/22  1009     History  Chief Complaint  Patient presents with   Emesis   Diarrhea    Bobby Lucas is a 42 y.o. male.  Patient is a healthy 42 year old male with no known medical problems who is presenting today with nausea vomiting and diarrhea.  Symptoms started at 11 PM last night and have been continuous all night long.  He felt fine throughout the day yesterday cannot think of any bad food exposure, no sick contacts and no recent travel.  Patient has had numerous episodes of vomiting and diarrhea greater than 10 each reports is still feeling nauseated and cannot hold anything down.  Has stomach cramps throughout but no localized area of pain.  No prior abdominal surgeries.  Denies any blood in his emesis or stool.  Patient does occasionally drink alcohol reports having 1 beer yesterday but no significant alcohol use.  Denies any chest or upper respiratory symptoms.  He does not take any medications regularly.  The history is provided by the patient.  Emesis Associated symptoms: diarrhea   Diarrhea Associated symptoms: vomiting        Home Medications Prior to Admission medications   Medication Sig Start Date End Date Taking? Authorizing Provider  metoCLOPramide (REGLAN) 10 MG tablet Take 1 tablet (10 mg total) by mouth every 6 (six) hours as needed for nausea or vomiting. 10/05/22  Yes Steele Stracener, Alphonzo Lemmings, MD  ondansetron (ZOFRAN ODT) 8 MG disintegrating tablet Take 1 tablet (8 mg total) by mouth every 8 (eight) hours as needed for nausea or vomiting. Patient not taking: Reported on 10/05/2020 07/26/20   Lorre Nick, MD  ondansetron [facility] ODT) 8 MG disintegrating tablet Take 1 tablet (8 mg total) by mouth every 8 (eight) hours as needed for nausea or vomiting. 10/05/20   Lorre Nick, MD      Allergies    Patient has no known allergies.    Review of Systems    Review of Systems  Gastrointestinal:  Positive for diarrhea and vomiting.    Physical Exam Updated Vital Signs BP (!) 148/76   Pulse (!) 59   Temp 97.6 F (36.4 C) (Oral)   Resp 18   Ht 6\' 5"  (1.956 m)   Wt 95.3 kg   SpO2 99%   BMI 24.90 kg/m  Physical Exam Vitals and nursing note reviewed.  Constitutional:      General: He is not in acute distress.    Appearance: He is well-developed. He is ill-appearing.  HENT:     Head: Normocephalic and atraumatic.     Mouth/Throat:     Mouth: Mucous membranes are moist.  Eyes:     Conjunctiva/sclera: Conjunctivae normal.     Pupils: Pupils are equal, round, and reactive to light.  Cardiovascular:     Rate and Rhythm: Normal rate and regular rhythm.     Heart sounds: No murmur heard. Pulmonary:     Effort: Pulmonary effort is normal. No respiratory distress.     Breath sounds: Normal breath sounds. No wheezing or rales.  Abdominal:     General: Bowel sounds are normal. There is no distension.     Palpations: Abdomen is soft.     Tenderness: There is no abdominal tenderness. There is no guarding or rebound.  Musculoskeletal:        General: No tenderness. Normal range of motion.  Cervical back: Normal range of motion and neck supple.  Skin:    General: Skin is warm and dry.     Coloration: Skin is pale.     Findings: No erythema or rash.  Neurological:     Mental Status: He is alert and oriented to person, place, and time.  Psychiatric:        Behavior: Behavior normal.     ED Results / Procedures / Treatments   Labs (all labs ordered are listed, but only abnormal results are displayed) Labs Reviewed  CBC WITH DIFFERENTIAL/PLATELET - Abnormal; Notable for the following components:      Result Value   Neutro Abs 7.8 (*)    All other components within normal limits  COMPREHENSIVE METABOLIC PANEL - Abnormal; Notable for the following components:   Glucose, Bld 135 (*)    Creatinine, Ser 1.29 (*)    All other  components within normal limits  LIPASE, BLOOD    EKG None  Radiology No results found.  Procedures Procedures    Medications Ordered in ED Medications  lactated ringers bolus 1,000 mL (1,000 mLs Intravenous New Bag/Given 10/05/22 1051)  ondansetron (ZOFRAN) injection 4 mg (4 mg Intravenous Given 10/05/22 1051)  metoCLOPramide (REGLAN) injection 10 mg (10 mg Intravenous Given 10/05/22 1217)    ED Course/ Medical Decision Making/ A&P                             Medical Decision Making Amount and/or Complexity of Data Reviewed Labs: ordered.  Risk Prescription drug management.   Pt with symptoms most consistent with a viral process with nausea/vomitting/diarrhea.  Denies bad food exposure and recent travel out of the country.  No recent abx.  No hx concerning for GU pathology or kidney stones.  Pt is awake and alert on exam without peritoneal signs.  1:57 PM I independently interpreted patient's labs and CBC, CMP and lipase without significant findings.  1:57 PM Patient is improved and tolerating p.o. dose.  At this time will discharge home.  No indication for imaging at this time.        Final Clinical Impression(s) / ED Diagnoses Final diagnoses:  Nausea vomiting and diarrhea    Rx / DC Orders ED Discharge Orders          Ordered    metoCLOPramide (REGLAN) 10 MG tablet  Every 6 hours PRN        10/05/22 1357              Gwyneth Sprout, MD 10/05/22 1357

## 2022-10-05 NOTE — Discharge Instructions (Signed)
All blood work looks normal today however you most likely have a bug that is going to take time to work itself out.  You can use the antinausea medication as needed.  Stick with a very bland diet over the next day or 2.  Return if your symptoms come back and you cannot hold anything down and start having severe abdominal pain or fever
